# Patient Record
Sex: Female | Born: 1975 | Race: Black or African American | Hispanic: No | Marital: Single
Health system: Southern US, Community
[De-identification: ages and names within clinical notes are randomized; demographics above are authoritative.]

---

## 2011-12-08 ENCOUNTER — Emergency Department: Payer: Self-pay | Admitting: *Deleted

## 2011-12-24 ENCOUNTER — Emergency Department: Payer: Self-pay | Admitting: Emergency Medicine

## 2011-12-24 LAB — COMPREHENSIVE METABOLIC PANEL
Albumin: 3.2 g/dL — ABNORMAL LOW (ref 3.4–5.0)
Alkaline Phosphatase: 63 U/L (ref 50–136)
Anion Gap: 6 — ABNORMAL LOW (ref 7–16)
BUN: 9 mg/dL (ref 7–18)
Bilirubin,Total: 0.6 mg/dL (ref 0.2–1.0)
Calcium, Total: 8.4 mg/dL — ABNORMAL LOW (ref 8.5–10.1)
Co2: 25 mmol/L (ref 21–32)
EGFR (African American): >60
Glucose: 109 mg/dL — ABNORMAL HIGH (ref 65–99)
SGOT(AST): 18 U/L (ref 15–37)
SGPT (ALT): 23 U/L
Sodium: 140 mmol/L (ref 136–145)
Total Protein: 6.4 g/dL (ref 6.4–8.2)

## 2011-12-24 LAB — CBC
HCT: 38.5 % (ref 35.0–47.0)
HGB: 12.6 g/dL (ref 12.0–16.0)
MCV: 90 fL (ref 80–100)
RDW: 14.4 % (ref 11.5–14.5)
WBC: 4.7 10*3/uL (ref 3.6–11.0)

## 2011-12-24 LAB — URINALYSIS, COMPLETE
Ketone: NEGATIVE
Ph: 7 (ref 4.5–8.0)
Protein: NEGATIVE
RBC,UR: <1 /HPF (ref 0–5)
Specific Gravity: 1.02 (ref 1.003–1.030)
Squamous Epithelial: 2

## 2011-12-24 LAB — LIPASE, BLOOD: Lipase: 97 U/L (ref 73–393)

## 2013-04-22 ENCOUNTER — Ambulatory Visit: Payer: Self-pay | Admitting: Obstetrics and Gynecology

## 2013-04-22 LAB — COMPREHENSIVE METABOLIC PANEL
Anion Gap: 3 — ABNORMAL LOW (ref 7–16)
BUN: 10 mg/dL (ref 7–18)
Bilirubin,Total: 0.5 mg/dL (ref 0.2–1.0)
Chloride: 108 mmol/L — ABNORMAL HIGH (ref 98–107)
Co2: 27 mmol/L (ref 21–32)
Creatinine: 0.91 mg/dL (ref 0.60–1.30)
EGFR (African American): >60
EGFR (Non-African Amer.): >60
Glucose: 88 mg/dL (ref 65–99)
Osmolality: 274 (ref 275–301)
Potassium: 3.6 mmol/L (ref 3.5–5.1)
SGPT (ALT): 20 U/L (ref 12–78)

## 2013-04-22 LAB — CBC
HCT: 36.6 % (ref 35.0–47.0)
MCH: 28.7 pg (ref 26.0–34.0)
Platelet: 133 10*3/uL — ABNORMAL LOW (ref 150–440)
RBC: 4.28 10*6/uL (ref 3.80–5.20)
WBC: 5.8 10*3/uL (ref 3.6–11.0)

## 2013-04-22 LAB — URINALYSIS, COMPLETE
Bacteria: NONE SEEN
Blood: NEGATIVE
Glucose,UR: NEGATIVE mg/dL (ref 0–75)
Nitrite: NEGATIVE
Ph: 7 (ref 4.5–8.0)
Protein: NEGATIVE
RBC,UR: 2 /HPF (ref 0–5)
Specific Gravity: 1.016 (ref 1.003–1.030)
WBC UR: 25 /HPF (ref 0–5)

## 2013-04-22 LAB — PREGNANCY, URINE: Pregnancy Test, Urine: NEGATIVE m[IU]/mL

## 2013-05-21 ENCOUNTER — Ambulatory Visit: Payer: Self-pay | Admitting: Obstetrics and Gynecology

## 2013-05-22 LAB — PATHOLOGY REPORT

## 2014-01-13 IMAGING — CR RIGHT FOOT COMPLETE - 3+ VIEW
1 series · 3 of 3 positions shown · non-contrast
Comparison: none

REASON FOR EXAM: had oven drop on foot...pt in [place]
COMMENTS:   LMP: 3 weeks ago

PROCEDURE:     DXR - DXR FOOT RT COMPLETE W/OBLIQUES  - December 08, 2011 [DATE]
RESULT:     Comparison:  None

[Series 1: ap · 0.17mm/px · 3 of 3 slices shown]
[im 1/3]
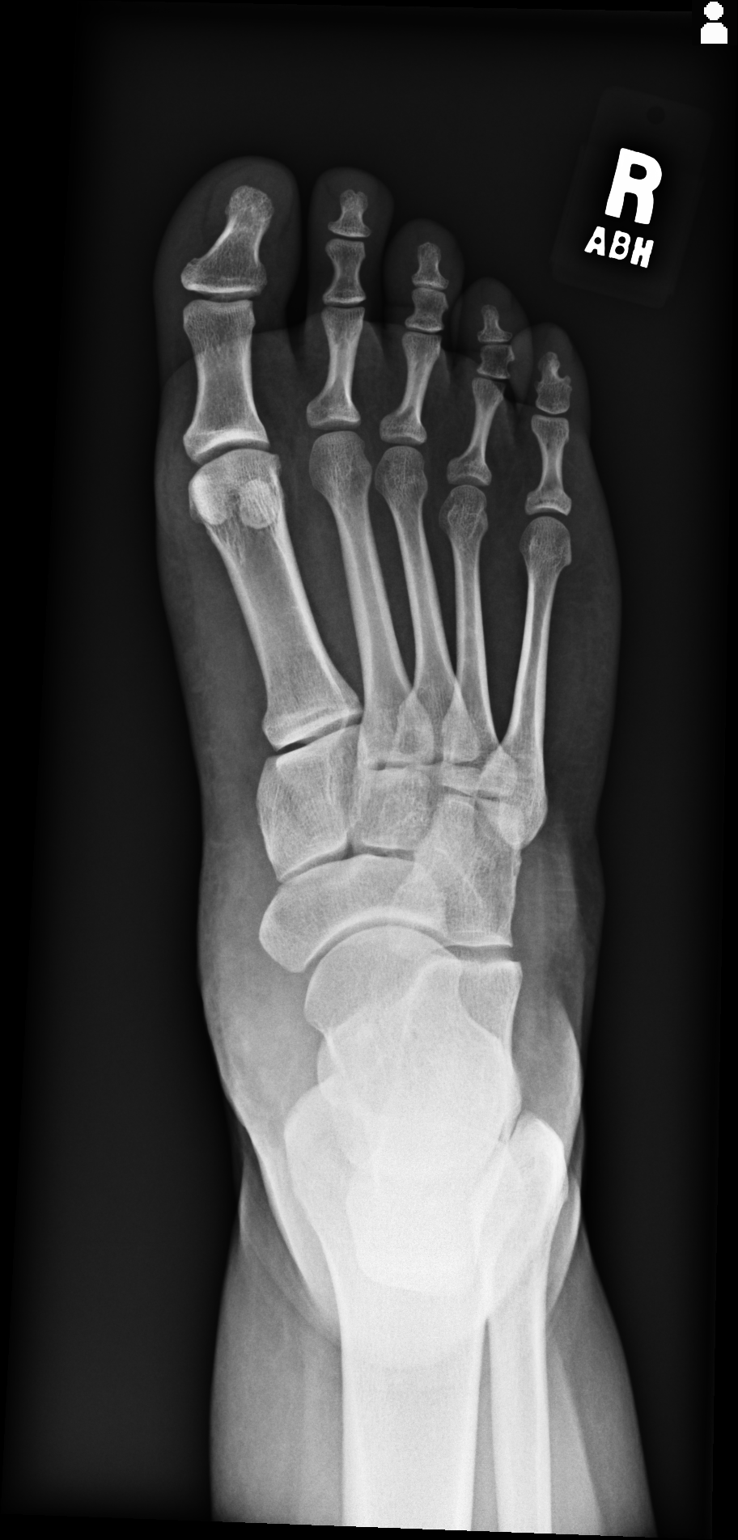
[im 2/3]
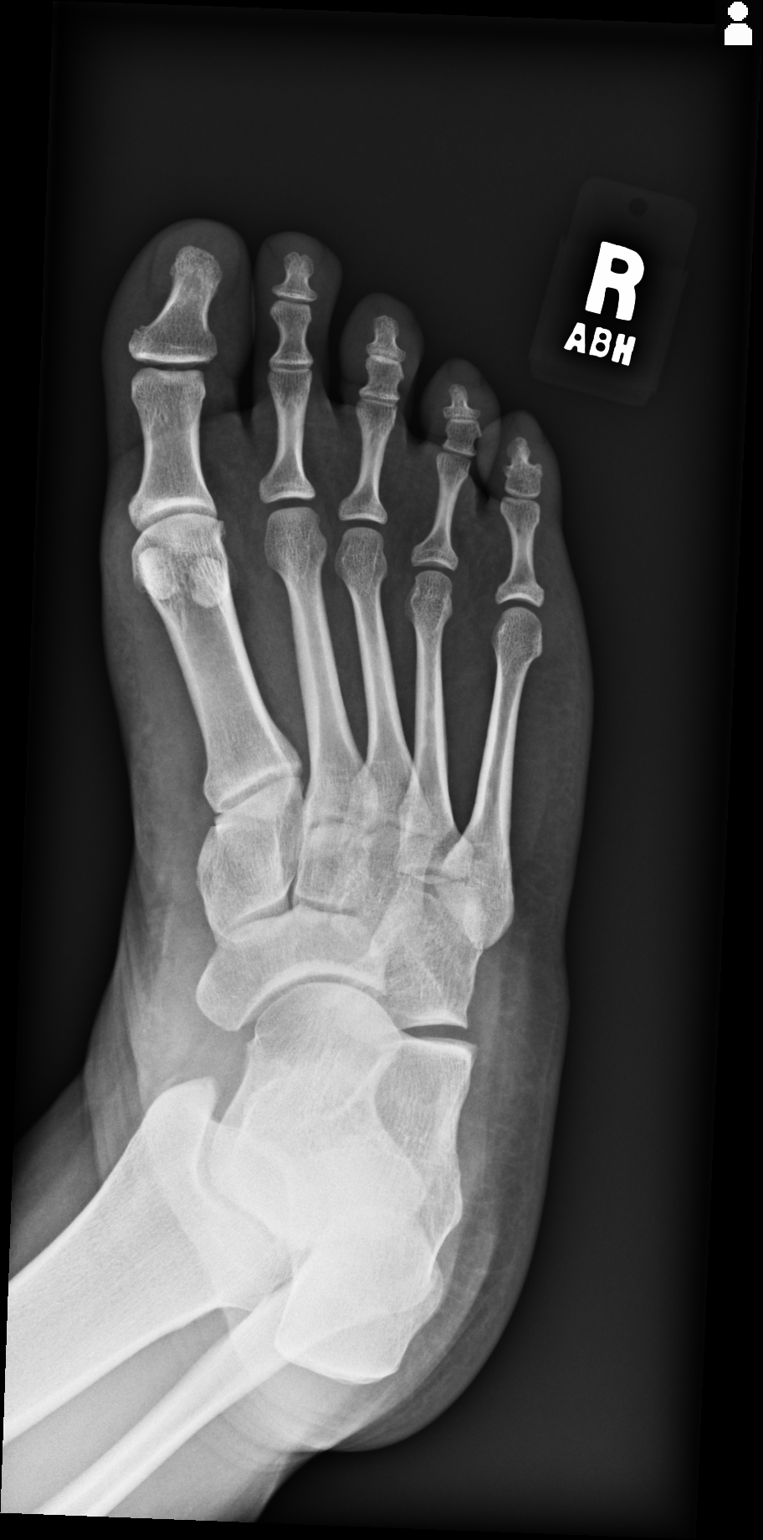
[im 3/3]
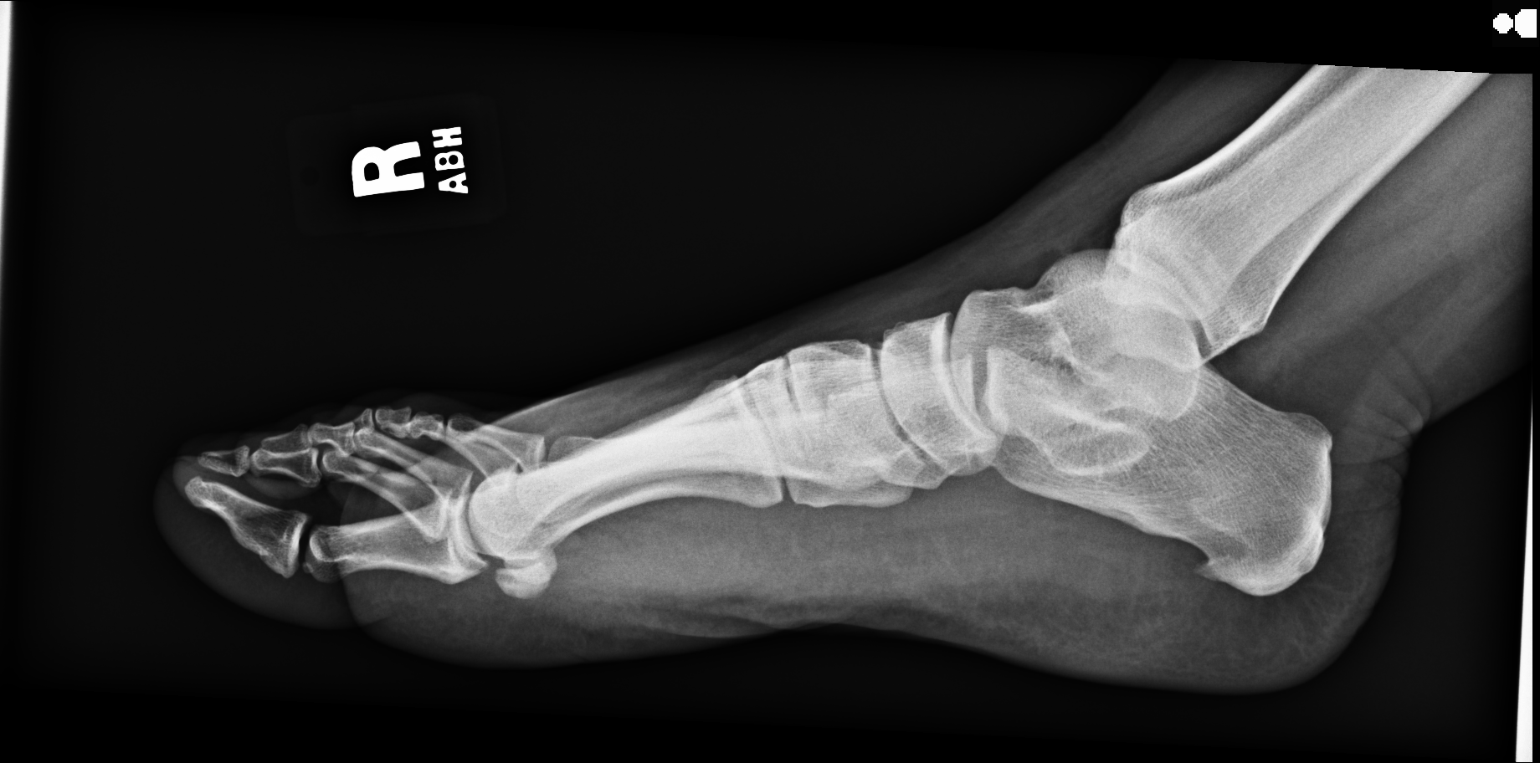

[3 of 3 positions shown; findings below may reference images not displayed]

FINDINGS: AP, oblique, and lateral views of the right foot demonstrates no fracture or
dislocation. There is a small plantar calcaneal spur. There is no soft
tissue abnormality. There is no subcutaneous emphysema or radiopaque foreign
bodies.
IMPRESSION: No acute osseous injury of the right foot.

[REDACTED]

## 2014-01-23 ENCOUNTER — Emergency Department: Payer: Self-pay | Admitting: Emergency Medicine

## 2014-01-23 LAB — COMPREHENSIVE METABOLIC PANEL
ALBUMIN: 2.9 g/dL — AB (ref 3.4–5.0)
ALK PHOS: 44 U/L — AB
ANION GAP: 7 (ref 7–16)
AST: 16 U/L (ref 15–37)
BUN: 7 mg/dL (ref 7–18)
Bilirubin,Total: 0.7 mg/dL (ref 0.2–1.0)
CALCIUM: 7.9 mg/dL — AB (ref 8.5–10.1)
CHLORIDE: 109 mmol/L — AB (ref 98–107)
Co2: 22 mmol/L (ref 21–32)
Creatinine: 0.91 mg/dL (ref 0.60–1.30)
EGFR (African American): >60
Glucose: 129 mg/dL — ABNORMAL HIGH (ref 65–99)
OSMOLALITY: 275 (ref 275–301)
Potassium: 3.7 mmol/L (ref 3.5–5.1)
SGPT (ALT): 18 U/L
Sodium: 138 mmol/L (ref 136–145)
Total Protein: 6.5 g/dL (ref 6.4–8.2)

## 2014-01-23 LAB — CBC WITH DIFFERENTIAL/PLATELET
BASOS ABS: 0.1 10*3/uL (ref 0.0–0.1)
BASOS PCT: 0.9 %
Eosinophil #: 0.1 10*3/uL (ref 0.0–0.7)
Eosinophil %: 1.6 %
HCT: 38.5 % (ref 35.0–47.0)
HGB: 12.1 g/dL (ref 12.0–16.0)
LYMPHS ABS: 2 10*3/uL (ref 1.0–3.6)
Lymphocyte %: 36.4 %
MCH: 28.1 pg (ref 26.0–34.0)
MCHC: 31.4 g/dL — AB (ref 32.0–36.0)
MCV: 90 fL (ref 80–100)
MONOS PCT: 9.6 %
Monocyte #: 0.5 x10 3/mm (ref 0.2–0.9)
NEUTROS ABS: 2.9 10*3/uL (ref 1.4–6.5)
NEUTROS PCT: 51.5 %
PLATELETS: 143 10*3/uL — AB (ref 150–440)
RBC: 4.3 10*6/uL (ref 3.80–5.20)
RDW: 14.5 % (ref 11.5–14.5)
WBC: 5.6 10*3/uL (ref 3.6–11.0)

## 2014-01-23 LAB — URINALYSIS, COMPLETE
BACTERIA: NONE SEEN
Bilirubin,UR: NEGATIVE
Blood: NEGATIVE
Glucose,UR: NEGATIVE mg/dL (ref 0–75)
Ketone: NEGATIVE
NITRITE: NEGATIVE
Ph: 6 (ref 4.5–8.0)
Protein: NEGATIVE
SPECIFIC GRAVITY: 1.005 (ref 1.003–1.030)
Squamous Epithelial: 3
WBC UR: 2 /HPF (ref 0–5)

## 2014-01-23 LAB — LIPASE, BLOOD: LIPASE: 70 U/L — AB (ref 73–393)

## 2014-01-23 LAB — PREGNANCY, URINE: Pregnancy Test, Urine: NEGATIVE m[IU]/mL

## 2014-10-22 NOTE — Op Note (Signed)
PATIENT NAME:  Isabel Mejia, Isabel Mejia MR#:  [id number] DATE OF BIRTH:  Dec 11, 1975  DATE OF PROCEDURE:  05/21/2013  PREOPERATIVE DIAGNOSIS: Menorrhagia and fibroids.  POSTOPERATIVE DIAGNOSIS: Menorrhagia and fibroids.  PROCEDURE: Total laparoscopic hysterectomy and cystoscopy.   ANESTHESIA: General.  SURGEON: Kaycen Whitworth A. Patton Salles, MD  ASSISTANT: Elliot Gurney, MD  ESTIMATED BLOOD LOSS: 600 mL.  OPERATIVE FLUIDS: 2 liters.   COMPLICATIONS: None.   FINDINGS:  1. Enlarged fibroid uterus.  2. Normal-appearing tubes and ovaries.   SPECIMENS: Uterus with cervix.   INDICATIONS: The patient is a 39 year old who presents with heavy menstrual bleeding, who desired surgical management. Risks, benefits, indications and alternatives of the procedure were explained, and informed consent was obtained.   PROCEDURE: The patient was taken to the operating room with IV fluids running. She was prepped and draped in the usual sterile fashion in [place] stirrups. A speculum was placed inside the vagina. The anterior lip of the cervix was grasped with a single-tooth tenaculum. A V-Care uterine manipulator was placed. Attention was turned to the patient's abdomen, where the infraumbilical region was injected with 0.5% Sensorcaine, and a 5 mm incision was made. The Veress needle was placed. The abdomen was insufflated with CO2 gas. The Veress needle was removed, and the abdomen was entered under direct visualization using a #5 mm XCEL trocar. Findings were as previously stated. The patient was placed in Trendelenburg position. Two additional 11 mm XCEL trocar ports were placed on the patient's abdomen laterally. The left uterine cornu was grasped with a tenaculum, and the tube, the utero-ovarian ligament and the round ligaments were cauterized using the Kleppinger. These were subsequently cut using the harmonic scalpel. The remainder of the broad ligament was likewise cauterized and cut with the harmonic scalpel. The  bladder flap was created on the patient's left side, and bladder tissue was removed from the lower uterine segment bluntly. The uterine artery was skeletonized at the cardinal ligament, cauterized and then cut with the Kleppinger. This was done with some difficulty secondary to bleeding from large uterine fibroids. Attention was turned to the patient's right side, where the right tube, utero-ovarian ligament and round ligaments were cauterized with the Kleppinger and cut with the hemorrhage scalpel. The bladder flap was created on the right side, to meet at the midline. The remainder of the broad ligament was cauterized and cut using the harmonic scalpel. The bladder flap was completely created. The uterine artery on the right side was also skeletonized, cauterized with the Kleppinger and cut using the harmonic scalpel. Again, because of large arterial flow to the uterine fibroids, there was some bleeding that was subsequently cauterized and made hemostatic using the Kleppinger. The V-Care uterine manipulator was identified through the lower uterine segment, and the colpotomy was made. The uterus was then removed through the vagina. A laparoscopic sponge placed in a glove was placed inside of the vagina to maintain pneumoperitoneum. The vaginal cuff was closed using the Endo Stitch closure device. A vaginal exam was performed to ensure that the cuff was completely closed. Cystoscopy was performed, and urine jets were seen to spill from both ureteral orifices, indicating bilateral ureteral patency, and there were no injuries to the bladder noted. Attention was turned back to the patient's abdomen, where the 11 mm incisions were closed using 4-0 Vicryl and the 5 mm infraumbilical incision was closed with Dermabond skin glue. The patient tolerated the procedure well. Sponge, needle and instrument counts were correct x2, and the patient was awakened from  anesthesia and taken to the recovery room in stable  condition.  ____________________________ Sonda PrimesLashawn A. Patton SallesWeaver-Lee, MD law:lb D: 05/21/2013 11:01:06 ET T: 05/21/2013 11:38:45 ET JOB#: [id number]  cc: Flint MelterLashawn A. Patton SallesWeaver-Lee, MD, <Dictator> Sonda PrimesLASHAWN A WEAVER LEE MD ELECTRONICALLY SIGNED 05/21/2013 23:42

## 2016-02-29 IMAGING — CR DG ABDOMEN 3V
1 series · 3 of 3 positions shown · non-contrast
Comparison: None.

CLINICAL DATA: Right lower quadrant pain

EXAM:
ABDOMEN SERIES

[Series 1: w chest pa · 0.14mm/px · 3 of 3 slices shown]
[im 1/3]
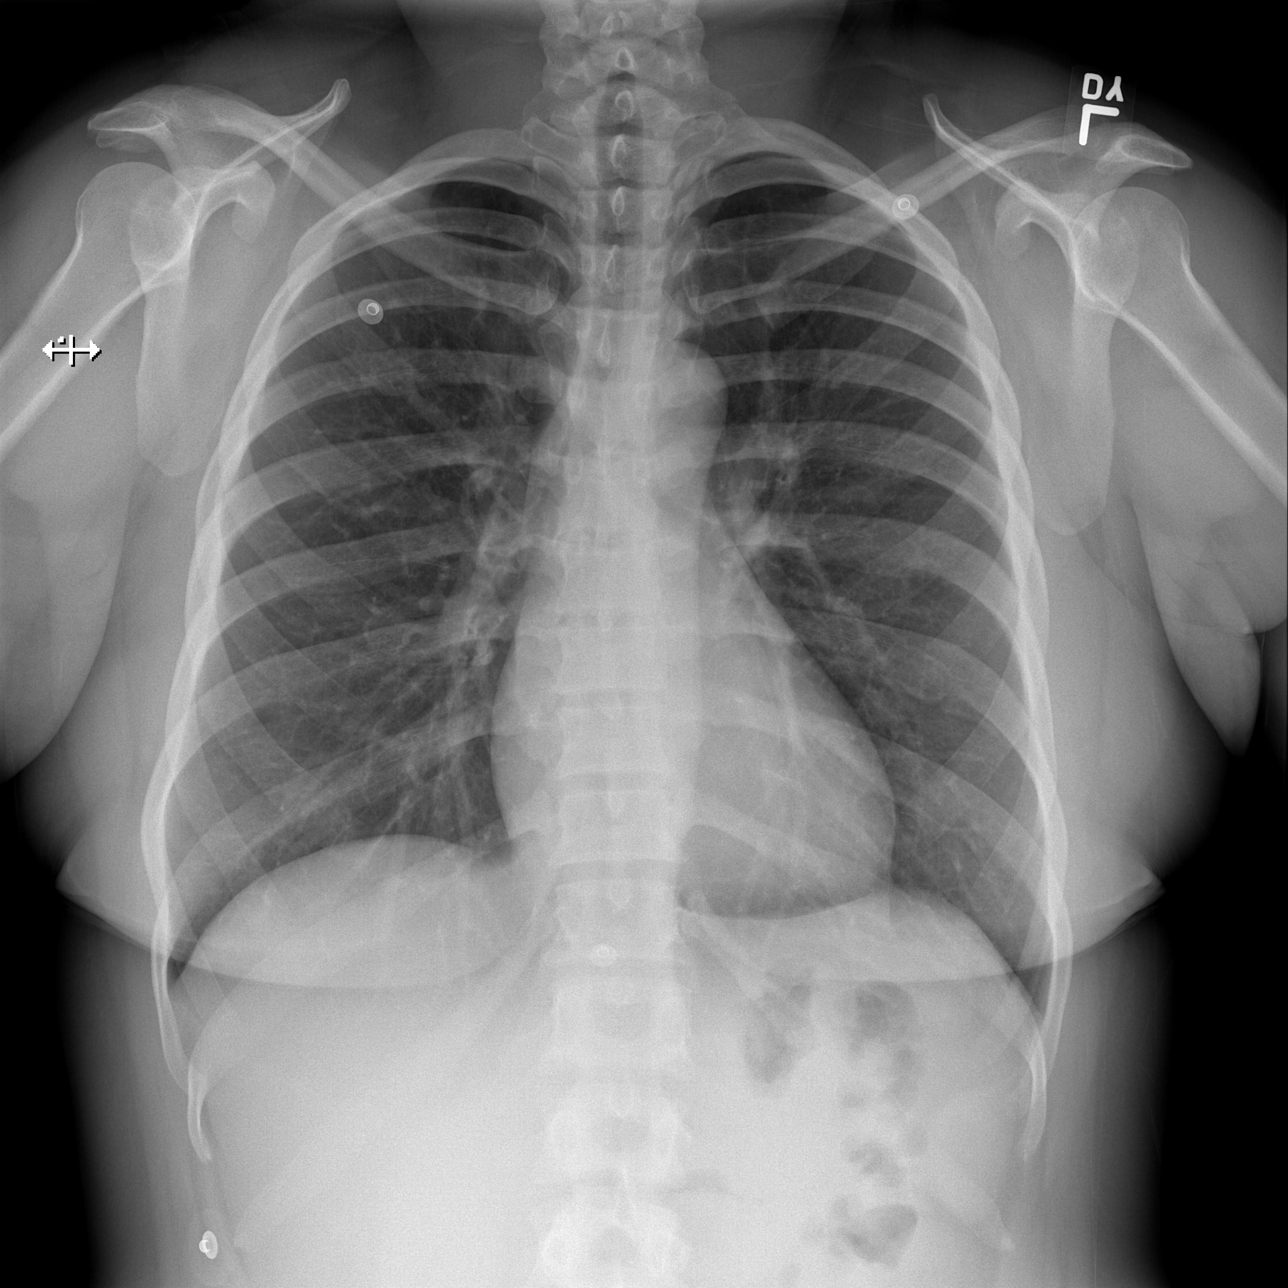
[im 2/3]
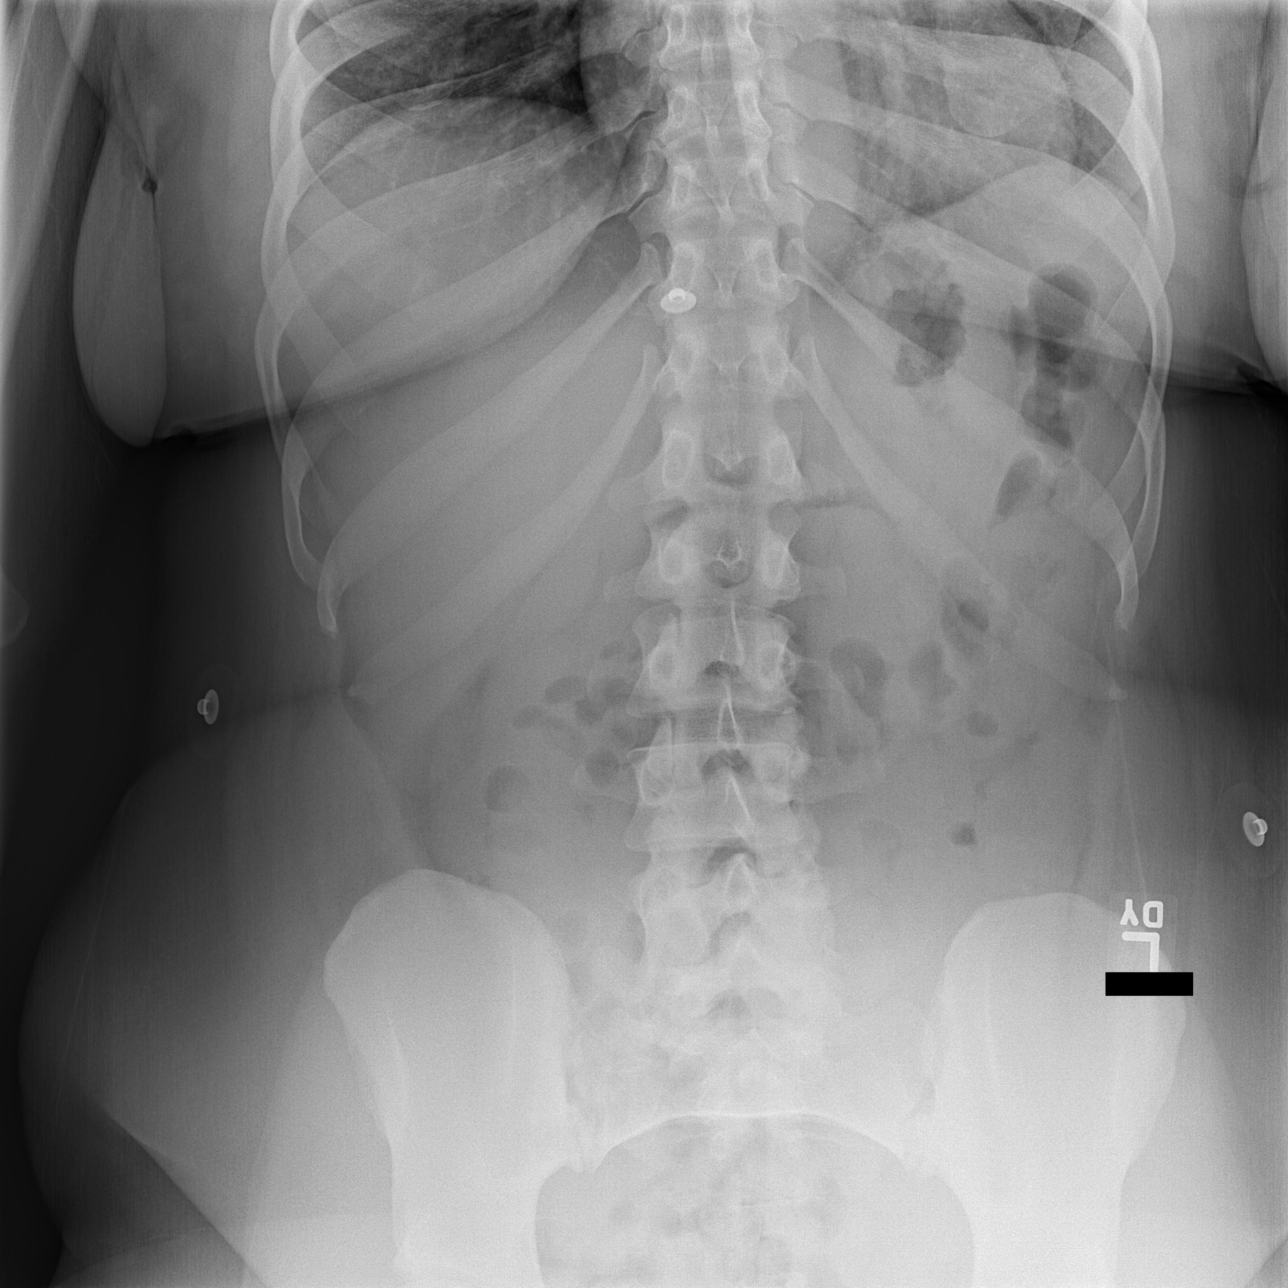
[im 3/3]
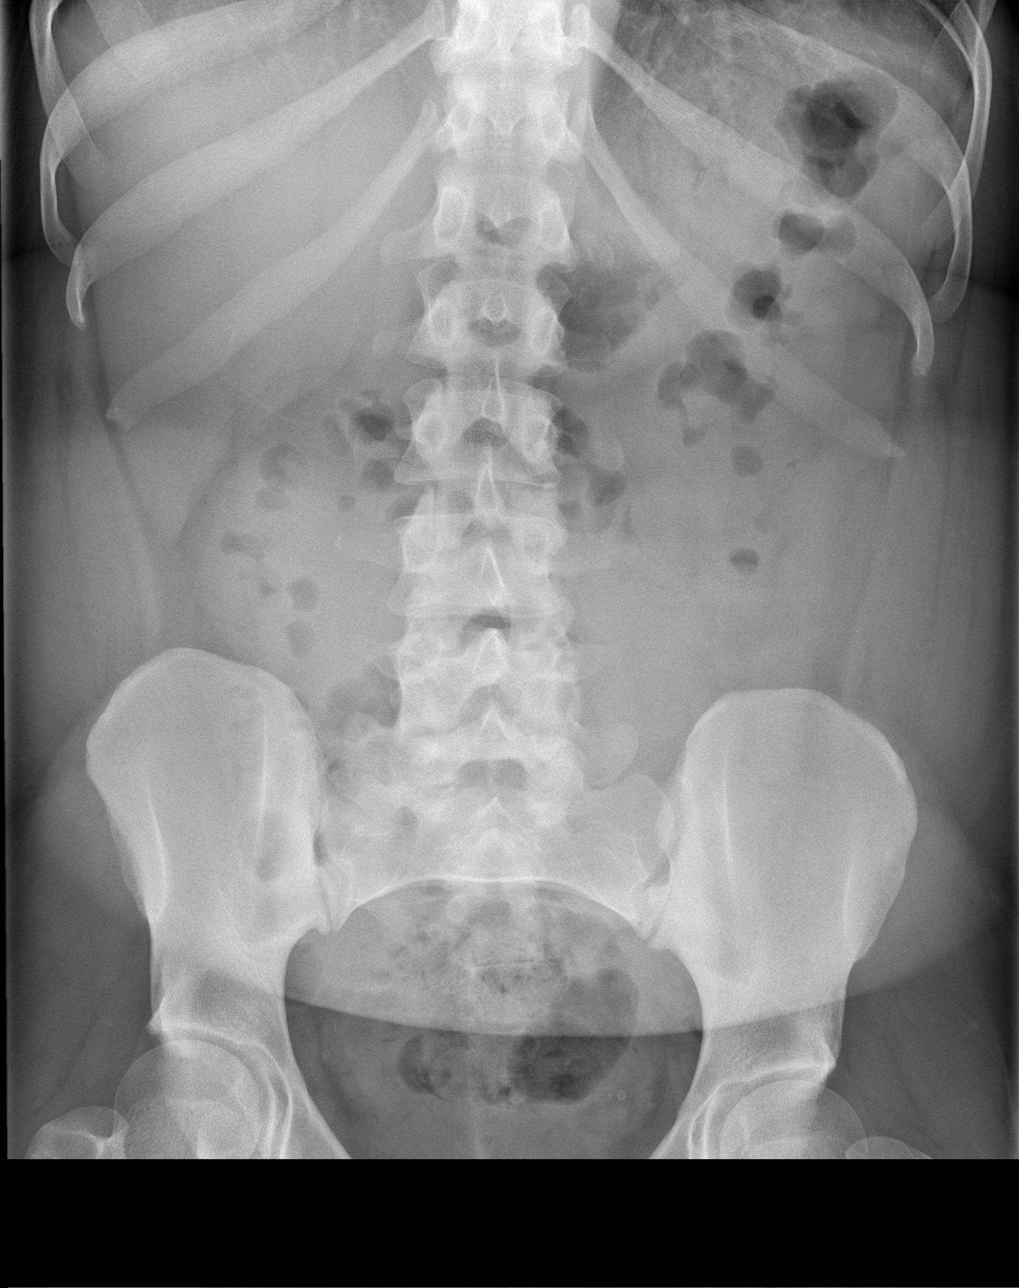

[3 of 3 positions shown; findings below may reference images not displayed]

FINDINGS: There is no evidence of dilated bowel loops or free intraperitoneal
air. No radiopaque calculi or other significant radiographic
abnormality is seen. Heart size and mediastinal contours are within
normal limits. Both lungs are clear.
IMPRESSION: Negative abdominal radiographs.  No acute cardiopulmonary disease.

## 2023-12-10 ENCOUNTER — Emergency Department (HOSPITAL_COMMUNITY)
Admission: EM | Admit: 2023-12-10 | Discharge: 2023-12-10 | Disposition: A | Discharge status: Home / Self Care | Attending: Emergency Medicine | Admitting: Emergency Medicine

## 2023-12-10 ENCOUNTER — Other Ambulatory Visit: Payer: Self-pay

## 2023-12-10 DIAGNOSIS — Z5948 Other specified lack of adequate food: Secondary | ICD-10-CM | POA: Insufficient documentation

## 2023-12-10 DIAGNOSIS — F419 Anxiety disorder, unspecified: Secondary | ICD-10-CM | POA: Insufficient documentation

## 2023-12-10 DIAGNOSIS — Z748 Other problems related to care provider dependency: Secondary | ICD-10-CM

## 2023-12-10 NOTE — ED Triage Notes (Signed)
 PT BIB [facility] . PT and her wife just arrived from [place] going to [place] and got on the wrong bus and ended up in [place]. PT is c/o anxiety and wanting something to eat. No pain at this time.

## 2023-12-10 NOTE — Care Management (Addendum)
 ED RNCM consulted concerning patient in [facility]18  who is here from [facility].  Apparently patient and wife were on their way to [place] from [place] and missed their stop in [place] and ended up in [place]. Patient reports not having a way to get to [facility], does not have any money to get transportation to [employer].  Explained that will only be able to provide a Parts bus ticket going [place] to [place] patient will need to receive a bus transfer to [employer].  Explained that the next bus will be leaving at 5:40 am.  We will be able to provide transportation to the bus depot for both her and her wife. Updated EDP and ED Nurse

## 2023-12-10 NOTE — ED Provider Notes (Signed)
 [place] EMERGENCY DEPARTMENT AT [facility] Provider Note   CSN: [id number] Arrival date & time: 12/10/23  1720     History  Chief Complaint  Patient presents with   Anxiety    Isabel Mejia is a 48 y.o. female here for social work needs.  The patient reports that she and her wife were leaving [place] to relocate in [place] with her wife's daughter so that they could find a shelter to live in there.  She states that she is just feeling a little overwhelmed and very hungry because she has not eaten in greater than 24 hours.  She has no other complaints at this time.    Anxiety       Home Medications Prior to Admission medications   Not on File      Allergies    Aspirin, Bee venom, Benadryl [diphenhydramine], and Penicillins    Review of Systems   Review of Systems  Physical Exam Updated Vital Signs BP (!) 136/99 (BP Location: Right Arm)   Pulse 75   Temp 98 F (36.7 C) (Oral)   Resp 16   Ht 5\' 5"  (1.651 m)   Wt 59 kg   SpO2 99%   BMI 21.63 kg/m  Physical Exam Vitals and nursing note reviewed.  Constitutional:      General: She is not in acute distress.    Appearance: She is well-developed. She is not diaphoretic.  HENT:     Head: Normocephalic and atraumatic.     Right Ear: External ear normal.     Left Ear: External ear normal.     Nose: Nose normal.     Mouth/Throat:     Mouth: Mucous membranes are moist.  Eyes:     General: No scleral icterus.    Conjunctiva/sclera: Conjunctivae normal.  Cardiovascular:     Rate and Rhythm: Normal rate and regular rhythm.     Heart sounds: Normal heart sounds. No murmur heard.    No friction rub. No gallop.  Pulmonary:     Effort: Pulmonary effort is normal. No respiratory distress.     Breath sounds: Normal breath sounds.  Abdominal:     General: Bowel sounds are normal. There is no distension.     Palpations: Abdomen is soft. There is no mass.     Tenderness: There is no abdominal  tenderness. There is no guarding.  Musculoskeletal:     Cervical back: Normal range of motion.  Skin:    General: Skin is warm and dry.  Neurological:     Mental Status: She is alert and oriented to person, place, and time.  Psychiatric:        Behavior: Behavior normal.     ED Results / Procedures / Treatments   Labs (all labs ordered are listed, but only abnormal results are displayed) Labs Reviewed - No data to display  EKG None  Radiology No results found.  Procedures Procedures    Medications Ordered in ED Medications - No data to display  ED Course/ Medical Decision Making/ A&P                                 Medical Decision Making  Patient here essentially for social work needs.  She was given a sandwich.  I involved transition of care team who are working to get them a ticket back to [facility].  No other medical complaints at this time.  Final Clinical Impression(s) / ED Diagnoses Final diagnoses:  None    Rx / DC Orders ED Discharge Orders     None         Tama Fails, PA-C 12/10/23 2000    Merdis Stalling, MD 12/10/23 2052

## 2023-12-10 NOTE — Discharge Instructions (Addendum)
 Return for any new emergent medical concerns
